# Patient Record
Sex: Female | Born: 1980 | Race: White | Hispanic: No | Marital: Married | Smoking: Former smoker
Health system: Southern US, Community
[De-identification: ages and names within clinical notes are randomized; demographics above are authoritative.]

## PROBLEM LIST (undated history)

## (undated) DIAGNOSIS — F329 Major depressive disorder, single episode, unspecified: Secondary | ICD-10-CM

## (undated) DIAGNOSIS — F32A Depression, unspecified: Secondary | ICD-10-CM

## (undated) DIAGNOSIS — G43909 Migraine, unspecified, not intractable, without status migrainosus: Secondary | ICD-10-CM

## (undated) DIAGNOSIS — F509 Eating disorder, unspecified: Secondary | ICD-10-CM

## (undated) HISTORY — PX: TONSILLECTOMY: SUR1361

## (undated) HISTORY — DX: Migraine, unspecified, not intractable, without status migrainosus: G43.909

## (undated) HISTORY — DX: Major depressive disorder, single episode, unspecified: F32.9

## (undated) HISTORY — DX: Depression, unspecified: F32.A

## (undated) HISTORY — PX: TYMPANOSTOMY TUBE PLACEMENT: SHX32

## (undated) HISTORY — DX: Eating disorder, unspecified: F50.9

---

## 2000-03-05 ENCOUNTER — Other Ambulatory Visit: Admission: RE | Admit: 2000-03-05 | Discharge: 2000-03-05 | Payer: Self-pay | Admitting: Unknown Physician Specialty

## 2002-10-21 ENCOUNTER — Encounter: Payer: Self-pay | Admitting: Orthopedic Surgery

## 2002-10-21 ENCOUNTER — Encounter: Admission: RE | Admit: 2002-10-21 | Discharge: 2002-10-21 | Payer: Self-pay | Admitting: Orthopedic Surgery

## 2002-12-16 ENCOUNTER — Other Ambulatory Visit: Admission: RE | Admit: 2002-12-16 | Discharge: 2002-12-16 | Payer: Self-pay | Admitting: Obstetrics & Gynecology

## 2004-01-30 ENCOUNTER — Other Ambulatory Visit: Admission: RE | Admit: 2004-01-30 | Discharge: 2004-01-30 | Payer: Self-pay | Admitting: Obstetrics and Gynecology

## 2004-01-31 ENCOUNTER — Other Ambulatory Visit: Admission: RE | Admit: 2004-01-31 | Discharge: 2004-01-31 | Payer: Self-pay | Admitting: Gynecology

## 2005-04-16 ENCOUNTER — Inpatient Hospital Stay (HOSPITAL_COMMUNITY): Admission: AD | Admit: 2005-04-16 | Discharge: 2005-04-18 | Payer: Self-pay | Admitting: *Deleted

## 2005-05-17 ENCOUNTER — Other Ambulatory Visit: Admission: RE | Admit: 2005-05-17 | Discharge: 2005-05-17 | Payer: Self-pay | Admitting: Obstetrics and Gynecology

## 2011-10-25 LAB — HM PAP SMEAR: HM Pap smear: NORMAL

## 2012-02-14 ENCOUNTER — Ambulatory Visit: Payer: Self-pay | Admitting: Family Medicine

## 2012-02-28 ENCOUNTER — Ambulatory Visit (INDEPENDENT_AMBULATORY_CARE_PROVIDER_SITE_OTHER): Payer: BC Managed Care – PPO | Admitting: Family Medicine

## 2012-02-28 ENCOUNTER — Encounter: Payer: Self-pay | Admitting: Family Medicine

## 2012-02-28 VITALS — Temp 98.3°F | Ht 69.25 in | Wt 236.6 lb

## 2012-02-28 DIAGNOSIS — F411 Generalized anxiety disorder: Secondary | ICD-10-CM

## 2012-02-28 DIAGNOSIS — R519 Headache, unspecified: Secondary | ICD-10-CM | POA: Insufficient documentation

## 2012-02-28 DIAGNOSIS — F988 Other specified behavioral and emotional disorders with onset usually occurring in childhood and adolescence: Secondary | ICD-10-CM

## 2012-02-28 DIAGNOSIS — X58XXXS Exposure to other specified factors, sequela: Secondary | ICD-10-CM

## 2012-02-28 DIAGNOSIS — IMO0001 Reserved for inherently not codable concepts without codable children: Secondary | ICD-10-CM

## 2012-02-28 DIAGNOSIS — S0990XS Unspecified injury of head, sequela: Secondary | ICD-10-CM

## 2012-02-28 DIAGNOSIS — R232 Flushing: Secondary | ICD-10-CM

## 2012-02-28 DIAGNOSIS — G44309 Post-traumatic headache, unspecified, not intractable: Secondary | ICD-10-CM

## 2012-02-28 DIAGNOSIS — R51 Headache: Secondary | ICD-10-CM

## 2012-02-28 DIAGNOSIS — F419 Anxiety disorder, unspecified: Secondary | ICD-10-CM | POA: Insufficient documentation

## 2012-02-28 LAB — BASIC METABOLIC PANEL
BUN: 9 mg/dL (ref 6–23)
Chloride: 103 mEq/L (ref 96–112)
Creat: 0.92 mg/dL (ref 0.50–1.10)
Glucose, Bld: 87 mg/dL (ref 70–99)
Potassium: 3.9 mEq/L (ref 3.5–5.3)

## 2012-02-28 LAB — HEPATIC FUNCTION PANEL
Albumin: 4.6 g/dL (ref 3.5–5.2)
Alkaline Phosphatase: 50 U/L (ref 39–117)
Total Bilirubin: 0.7 mg/dL (ref 0.3–1.2)
Total Protein: 7.2 g/dL (ref 6.0–8.3)

## 2012-02-28 LAB — CBC WITH DIFFERENTIAL/PLATELET
Basophils Absolute: 0 10*3/uL (ref 0.0–0.1)
Basophils Relative: 0 % (ref 0–1)
Eosinophils Absolute: 0.1 10*3/uL (ref 0.0–0.7)
Eosinophils Relative: 1 % (ref 0–5)
MCH: 27.7 pg (ref 26.0–34.0)
MCHC: 34.3 g/dL (ref 30.0–36.0)
MCV: 81 fL (ref 78.0–100.0)
Neutrophils Relative %: 51 % (ref 43–77)
Platelets: 331 10*3/uL (ref 150–400)
RBC: 4.83 MIL/uL (ref 3.87–5.11)
RDW: 13.6 % (ref 11.5–15.5)

## 2012-02-28 MED ORDER — ALPRAZOLAM 0.5 MG PO TABS: 1.0000 mg | ORAL_TABLET | ORAL | Status: DC | PRN

## 2012-02-28 MED ORDER — AMPHETAMINE-DEXTROAMPHET ER 20 MG PO CP24: 20.0000 mg | ORAL_CAPSULE | ORAL | Status: DC

## 2012-02-28 MED ORDER — CYCLOBENZAPRINE HCL 10 MG PO TABS: 10.0000 mg | ORAL_TABLET | Freq: Three times a day (TID) | ORAL | Status: AC | PRN
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
## 2012-02-28 NOTE — Assessment & Plan Note (Signed)
adderall xr 20 mg  Daily rto 1 month or sooner prn

## 2012-02-28 NOTE — Assessment & Plan Note (Signed)
con't meds 

## 2012-02-28 NOTE — Patient Instructions (Signed)
Attention Deficit Hyperactivity Disorder Attention deficit hyperactivity disorder (ADHD) is a problem with behavior issues based on the way the brain functions (neurobehavioral disorder). It is a common reason for behavior and academic problems in school. CAUSES  The cause of ADHD is unknown in most cases. It may run in families. It sometimes can be associated with learning disabilities and other behavioral problems. SYMPTOMS  There are 3 types of ADHD. The 3 types and some of the symptoms include:  Inattentive   Gets bored or distracted easily.   Loses or forgets things. Forgets to hand in homework.   Has trouble organizing or completing tasks.   Difficulty staying on task.   An inability to organize daily tasks and school work.   Leaving projects, chores, or homework unfinished.   Trouble paying attention or responding to details. Careless mistakes.   Difficulty following directions. Often seems like is not listening.   Dislikes activities that require sustained attention (like chores or homework).   Hyperactive-impulsive   Feels like it is impossible to sit still or stay in a seat. Fidgeting with hands and feet.   Trouble waiting turn.   Talking too much or out of turn. Interruptive.   Speaks or acts impulsively.   Aggressive, disruptive behavior.   Constantly busy or on the go, noisy.   Combined   Has symptoms of both of the above.  Often children with ADHD feel discouraged about themselves and with school. They often perform well below their abilities in school. These symptoms can cause problems in home, school, and in relationships with peers. As children get older, the excess motor activities can calm down, but the problems with paying attention and staying organized persist. Most children do not outgrow ADHD but with good treatment can learn to cope with the symptoms. DIAGNOSIS  When ADHD is suspected, the diagnosis should be made by professionals trained in  ADHD.  Diagnosis will include:  Ruling out other reasons for the child's behavior.   The caregivers will check with the child's school and check their medical records.   They will talk to teachers and parents.   Behavior rating scales for the child will be filled out by those dealing with the child on a daily basis.  A diagnosis is made only after all information has been considered. TREATMENT  Treatment usually includes behavioral treatment often along with medicines. It may include stimulant medicines. The stimulant medicines decrease impulsivity and hyperactivity and increase attention. Other medicines used include antidepressants and certain blood pressure medicines. Most experts agree that treatment for ADHD should address all aspects of the child's functioning. Treatment should not be limited to the use of medicines alone. Treatment should include structured classroom management. The parents must receive education to address rewarding good behavior, discipline, and limit-setting. Tutoring or behavioral therapy or both should be available for the child. If untreated, the disorder can have long-term serious effects into adolescence and adulthood. HOME CARE INSTRUCTIONS   Often with ADHD there is a lot of frustration among the family in dealing with the illness. There is often blame and anger that is not warranted. This is a life long illness. There is no way to prevent ADHD. In many cases, because the problem affects the family as a whole, the entire family may need help. A therapist can help the family find better ways to handle the disruptive behaviors and promote change. If the child is young, most of the therapist's work is with the parents. Parents will   learn techniques for coping with and improving their child's behavior. Sometimes only the child with the ADHD needs counseling. Your caregivers can help you make these decisions.   Children with ADHD may need help in organizing. Some  helpful tips include:   Keep routines the same every day from wake-up time to bedtime. Schedule everything. This includes homework and playtime. This should include outdoor and indoor recreation. Keep the schedule on the refrigerator or a bulletin board where it is frequently seen. Mark schedule changes as far in advance as possible.   Have a place for everything and keep everything in its place. This includes clothing, backpacks, and school supplies.   Encourage writing down assignments and bringing home needed books.   Offer your child a well-balanced diet. Breakfast is especially important for school performance. Children should avoid drinks with caffeine including:   Soft drinks.   Coffee.   Tea.   However, some older children (adolescents) may find these drinks helpful in improving their attention.   Children with ADHD need consistent rules that they can understand and follow. If rules are followed, give small rewards. Children with ADHD often receive, and expect, criticism. Look for good behavior and praise it. Set realistic goals. Give clear instructions. Look for activities that can foster success and self-esteem. Make time for pleasant activities with your child. Give lots of affection.   Parents are their children's greatest advocates. Learn as much as possible about ADHD. This helps you become a stronger and better advocate for your child. It also helps you educate your child's teachers and instructors if they feel inadequate in these areas. Parent support groups are often helpful. A national group with local chapters is called CHADD (Children and Adults with Attention Deficit Hyperactivity Disorder).  PROGNOSIS  There is no cure for ADHD. Children with the disorder seldom outgrow it. Many find adaptive ways to accommodate the ADHD as they mature. SEEK MEDICAL CARE IF:  Your child has repeated muscle twitches, cough or speech outbursts.   Your child has sleep problems.   Your  child has a marked loss of appetite.   Your child develops depression.   Your child has new or worsening behavioral problems.   Your child develops dizziness.   Your child has a racing heart.   Your child has stomach pains.   Your child develops headaches.  Document Released: 08/30/2002 Document Revised: 08/29/2011 Document Reviewed: 04/11/2008 Exit[employer] Patient Information 2012 [employer]. 
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
## 2012-02-29 LAB — T3, FREE: T3, Free: 3.3 pg/mL (ref 2.3–4.2)

## 2012-02-29 LAB — T4, FREE: Free T4: 1.28 ng/dL (ref 0.80–1.80)

## 2012-02-29 LAB — TSH: TSH: 1.443 u[IU]/mL (ref 0.350–4.500)

## 2012-03-07 ENCOUNTER — Ambulatory Visit (HOSPITAL_BASED_OUTPATIENT_CLINIC_OR_DEPARTMENT_OTHER)
Admission: RE | Admit: 2012-03-07 | Discharge: 2012-03-07 | Disposition: A | Discharge status: Home / Self Care | Payer: BC Managed Care – PPO | Source: Ambulatory Visit | Attending: Family Medicine | Admitting: Family Medicine

## 2012-03-07 DIAGNOSIS — IMO0001 Reserved for inherently not codable concepts without codable children: Secondary | ICD-10-CM | POA: Insufficient documentation

## 2012-03-07 DIAGNOSIS — G44329 Chronic post-traumatic headache, not intractable: Secondary | ICD-10-CM | POA: Insufficient documentation

## 2012-03-07 DIAGNOSIS — G44309 Post-traumatic headache, unspecified, not intractable: Secondary | ICD-10-CM

## 2012-03-11 ENCOUNTER — Telehealth: Payer: Self-pay

## 2012-03-11 DIAGNOSIS — F988 Other specified behavioral and emotional disorders with onset usually occurring in childhood and adolescence: Secondary | ICD-10-CM

## 2012-03-11 NOTE — Telephone Encounter (Signed)
Spoke with patient and she stated she is taking the Adderall but it is wearing off after about 2 hours. She wants to know if she needs to increase. Please advise    KP

## 2012-03-11 NOTE — Telephone Encounter (Signed)
She can try taking 2--- if it is too much we can change to 30 mg .

## 2012-03-11 NOTE — Progress Notes (Signed)
If no relief from chiropractor --- refer neuro

## 2012-03-12 NOTE — Telephone Encounter (Signed)
Discussed with patient and she voiced understanding. She will call on Monday with results.     KP

## 2012-03-18 MED ORDER — AMPHETAMINE-DEXTROAMPHET ER 20 MG PO CP24: 20.0000 mg | ORAL_CAPSULE | ORAL | Status: DC

## 2012-03-18 NOTE — Telephone Encounter (Signed)
Patient aware Rx will be ready for pick up tomorrow.      KP 

## 2012-03-18 NOTE — Telephone Encounter (Signed)
Please advise      KP 

## 2012-03-18 NOTE — Telephone Encounter (Signed)
Ok to increase to 2 a day--- we discussed it when she was here

## 2012-03-18 NOTE — Addendum Note (Signed)
Addended by: Arnette Norris on: 03/18/2012 01:49 PM   Modules accepted: Orders

## 2012-03-18 NOTE — Telephone Encounter (Signed)
Patient called today and stated the 2-pills worked and she is now in need of a refill Please call when ready [phone], advised on prescription policy patient voiced understanding

## 2012-04-03 ENCOUNTER — Ambulatory Visit (INDEPENDENT_AMBULATORY_CARE_PROVIDER_SITE_OTHER): Payer: BC Managed Care – PPO | Admitting: Family Medicine

## 2012-04-03 ENCOUNTER — Encounter: Payer: Self-pay | Admitting: Family Medicine

## 2012-04-03 VITALS — BP 120/80 | HR 92 | Temp 98.1°F | Wt 239.2 lb

## 2012-04-03 DIAGNOSIS — R51 Headache: Secondary | ICD-10-CM

## 2012-04-03 DIAGNOSIS — F329 Major depressive disorder, single episode, unspecified: Secondary | ICD-10-CM | POA: Insufficient documentation

## 2012-04-03 DIAGNOSIS — F988 Other specified behavioral and emotional disorders with onset usually occurring in childhood and adolescence: Secondary | ICD-10-CM

## 2012-04-03 MED ORDER — AMPHETAMINE-DEXTROAMPHET ER 30 MG PO CP24: ORAL_CAPSULE | ORAL | Status: DC

## 2012-04-03 MED ORDER — FLUOXETINE HCL 20 MG PO TABS: ORAL_TABLET | ORAL | Status: DC

## 2012-04-03 NOTE — Patient Instructions (Signed)
Attention Deficit Hyperactivity Disorder Attention deficit hyperactivity disorder (ADHD) is a problem with behavior issues based on the way the brain functions (neurobehavioral disorder). It is a common reason for behavior and academic problems in school. CAUSES  The cause of ADHD is unknown in most cases. It may run in families. It sometimes can be associated with learning disabilities and other behavioral problems. SYMPTOMS  There are 3 types of ADHD. The 3 types and some of the symptoms include:  Inattentive   Gets bored or distracted easily.   Loses or forgets things. Forgets to hand in homework.   Has trouble organizing or completing tasks.   Difficulty staying on task.   An inability to organize daily tasks and school work.   Leaving projects, chores, or homework unfinished.   Trouble paying attention or responding to details. Careless mistakes.   Difficulty following directions. Often seems like is not listening.   Dislikes activities that require sustained attention (like chores or homework).   Hyperactive-impulsive   Feels like it is impossible to sit still or stay in a seat. Fidgeting with hands and feet.   Trouble waiting turn.   Talking too much or out of turn. Interruptive.   Speaks or acts impulsively.   Aggressive, disruptive behavior.   Constantly busy or on the go, noisy.   Combined   Has symptoms of both of the above.  Often children with ADHD feel discouraged about themselves and with school. They often perform well below their abilities in school. These symptoms can cause problems in home, school, and in relationships with peers. As children get older, the excess motor activities can calm down, but the problems with paying attention and staying organized persist. Most children do not outgrow ADHD but with good treatment can learn to cope with the symptoms. DIAGNOSIS  When ADHD is suspected, the diagnosis should be made by professionals trained in  ADHD.  Diagnosis will include:  Ruling out other reasons for the child's behavior.   The caregivers will check with the child's school and check their medical records.   They will talk to teachers and parents.   Behavior rating scales for the child will be filled out by those dealing with the child on a daily basis.  A diagnosis is made only after all information has been considered. TREATMENT  Treatment usually includes behavioral treatment often along with medicines. It may include stimulant medicines. The stimulant medicines decrease impulsivity and hyperactivity and increase attention. Other medicines used include antidepressants and certain blood pressure medicines. Most experts agree that treatment for ADHD should address all aspects of the child's functioning. Treatment should not be limited to the use of medicines alone. Treatment should include structured classroom management. The parents must receive education to address rewarding good behavior, discipline, and limit-setting. Tutoring or behavioral therapy or both should be available for the child. If untreated, the disorder can have long-term serious effects into adolescence and adulthood. HOME CARE INSTRUCTIONS   Often with ADHD there is a lot of frustration among the family in dealing with the illness. There is often blame and anger that is not warranted. This is a life long illness. There is no way to prevent ADHD. In many cases, because the problem affects the family as a whole, the entire family may need help. A therapist can help the family find better ways to handle the disruptive behaviors and promote change. If the child is young, most of the therapist's work is with the parents. Parents will   learn techniques for coping with and improving their child's behavior. Sometimes only the child with the ADHD needs counseling. Your caregivers can help you make these decisions.   Children with ADHD may need help in organizing. Some  helpful tips include:   Keep routines the same every day from wake-up time to bedtime. Schedule everything. This includes homework and playtime. This should include outdoor and indoor recreation. Keep the schedule on the refrigerator or a bulletin board where it is frequently seen. Mark schedule changes as far in advance as possible.   Have a place for everything and keep everything in its place. This includes clothing, backpacks, and school supplies.   Encourage writing down assignments and bringing home needed books.   Offer your child a well-balanced diet. Breakfast is especially important for school performance. Children should avoid drinks with caffeine including:   Soft drinks.   Coffee.   Tea.   However, some older children (adolescents) may find these drinks helpful in improving their attention.   Children with ADHD need consistent rules that they can understand and follow. If rules are followed, give small rewards. Children with ADHD often receive, and expect, criticism. Look for good behavior and praise it. Set realistic goals. Give clear instructions. Look for activities that can foster success and self-esteem. Make time for pleasant activities with your child. Give lots of affection.   Parents are their children's greatest advocates. Learn as much as possible about ADHD. This helps you become a stronger and better advocate for your child. It also helps you educate your child's teachers and instructors if they feel inadequate in these areas. Parent support groups are often helpful. A national group with local chapters is called CHADD (Children and Adults with Attention Deficit Hyperactivity Disorder).  PROGNOSIS  There is no cure for ADHD. Children with the disorder seldom outgrow it. Many find adaptive ways to accommodate the ADHD as they mature. SEEK MEDICAL CARE IF:  Your child has repeated muscle twitches, cough or speech outbursts.   Your child has sleep problems.   Your  child has a marked loss of appetite.   Your child develops depression.   Your child has new or worsening behavioral problems.   Your child develops dizziness.   Your child has a racing heart.   Your child has stomach pains.   Your child develops headaches.  Document Released: 08/30/2002 Document Revised: 08/29/2011 Document Reviewed: 04/11/2008 Exit[employer] Patient Information 2012 [employer]. 

## 2012-04-03 NOTE — Assessment & Plan Note (Signed)
Increase to prozac to 60 mg  20 mg  ,  3 tab a day con't wellbutrin

## 2012-04-03 NOTE — Progress Notes (Signed)
  Subjective:    Patient ID: Deborah Montes, female    DOB: 03-Jul-1981, 31 y.o.   MRN: [id number]  HPI Pt here f/u depression and add.  She feels like both need to be increased.  Her headaches are better but now having sinus issues.   Pt is not suicidal. Just still having problems with attention. \  Review of Systems As above    Objective:   Physical Exam  Constitutional: She is oriented to person, place, and time. She appears well-developed and well-nourished.  Cardiovascular: Normal rate, regular rhythm and normal heart sounds.   No murmur heard. Pulmonary/Chest: Effort normal and breath sounds normal.  Neurological: She is alert and oriented to person, place, and time.  Skin: Skin is warm and dry.  Psychiatric: She has a normal mood and affect. Her behavior is normal. Judgment and thought content normal.          Assessment & Plan:

## 2012-04-03 NOTE — Assessment & Plan Note (Signed)
Increase adderall to 30 mg 2 a day rto 6 months or sooner prn

## 2012-04-03 NOTE — Assessment & Plan Note (Signed)
?   Sinus---  Take flonase daily and con't other allergy meds If symptoms persist --rto to be evaluated for abx

## 2012-04-08 ENCOUNTER — Telehealth: Payer: Self-pay | Admitting: *Deleted

## 2012-04-08 MED ORDER — AMPHETAMINE-DEXTROAMPHETAMINE 20 MG PO TABS: 20.0000 mg | ORAL_TABLET | Freq: Two times a day (BID) | ORAL | Status: DC

## 2012-04-08 NOTE — Telephone Encounter (Signed)
Pt called to ask if MD Lowne can switch her Adderall RX to the regular formula instead of the XR formula per notes recent OV with MD Lowne and had discussion about the change and has now decided she does want to switch, pt notes she has NOT gotten the recent RX filled for Adderall XR 30mg  filled and wanted to know if she brought this RX back could she change for the new RX, please advise

## 2012-04-08 NOTE — Telephone Encounter (Signed)
Ok to bring back rx and give her rx for adderall 20 mg bid and ov in 1 month

## 2012-04-08 NOTE — Telephone Encounter (Signed)
Discussed with patient and she will bring back the old RX.     KP

## 2012-04-10 ENCOUNTER — Telehealth: Payer: Self-pay | Admitting: *Deleted

## 2012-04-10 NOTE — Telephone Encounter (Signed)
agreed

## 2012-04-10 NOTE — Telephone Encounter (Signed)
Pt states that the new Rx for adderall is not strong enough and would like to change back to her previous dose. Verbally advised Dr Laury Axon who states that she will not change Rx back. Per kim provide Pt with list of psych. Discuss with patient Will placed list up front for Pt to pick up.

## 2012-04-13 ENCOUNTER — Ambulatory Visit (INDEPENDENT_AMBULATORY_CARE_PROVIDER_SITE_OTHER): Payer: BC Managed Care – PPO | Admitting: Family Medicine

## 2012-04-13 ENCOUNTER — Encounter: Payer: Self-pay | Admitting: Family Medicine

## 2012-04-13 VITALS — BP 120/82 | HR 94 | Temp 98.5°F | Wt 236.4 lb

## 2012-04-13 DIAGNOSIS — F988 Other specified behavioral and emotional disorders with onset usually occurring in childhood and adolescence: Secondary | ICD-10-CM

## 2012-04-13 MED ORDER — AMPHETAMINE-DEXTROAMPHET ER 20 MG PO CP24: 20.0000 mg | ORAL_CAPSULE | ORAL | Status: DC

## 2012-04-13 MED ORDER — FLUOXETINE HCL 20 MG PO TABS: ORAL_TABLET | ORAL | Status: DC

## 2012-04-13 NOTE — Assessment & Plan Note (Signed)
adderall 20 mg bid--- pt gave [place] XR rx Make appointment with psych rto 6 months or sooner prn

## 2012-04-13 NOTE — Progress Notes (Signed)
  Subjective:    Patient ID: Deborah Montes, female    DOB: Nov 21, 1980, 31 y.o.   MRN: [id number]  HPI Pt here f/u ADD--- the xr made her sick so she would like to switch back to immediate release. No other complaints and she is willing to go to psych for testing.   Review of Systems As above    Objective:   Physical Exam  Constitutional: She is oriented to person, place, and time. She appears well-developed and well-nourished.  Neck: Normal range of motion. Neck supple.  Cardiovascular: Normal rate, regular rhythm and normal heart sounds.   No murmur heard. Pulmonary/Chest: Effort normal and breath sounds normal. No respiratory distress. She has no wheezes. She has no rales. She exhibits no tenderness.  Neurological: She is alert and oriented to person, place, and time.  Psychiatric: She has a normal mood and affect. Her behavior is normal. Judgment and thought content normal.          Assessment & Plan:

## 2012-04-13 NOTE — Patient Instructions (Signed)
Attention Deficit Hyperactivity Disorder Attention deficit hyperactivity disorder (ADHD) is a problem with behavior issues based on the way the brain functions (neurobehavioral disorder). It is a common reason for behavior and academic problems in school. CAUSES  The cause of ADHD is unknown in most cases. It may run in families. It sometimes can be associated with learning disabilities and other behavioral problems. SYMPTOMS  There are 3 types of ADHD. The 3 types and some of the symptoms include:  Inattentive   Gets bored or distracted easily.   Loses or forgets things. Forgets to hand in homework.   Has trouble organizing or completing tasks.   Difficulty staying on task.   An inability to organize daily tasks and school work.   Leaving projects, chores, or homework unfinished.   Trouble paying attention or responding to details. Careless mistakes.   Difficulty following directions. Often seems like is not listening.   Dislikes activities that require sustained attention (like chores or homework).   Hyperactive-impulsive   Feels like it is impossible to sit still or stay in a seat. Fidgeting with hands and feet.   Trouble waiting turn.   Talking too much or out of turn. Interruptive.   Speaks or acts impulsively.   Aggressive, disruptive behavior.   Constantly busy or on the go, noisy.   Combined   Has symptoms of both of the above.  Often children with ADHD feel discouraged about themselves and with school. They often perform well below their abilities in school. These symptoms can cause problems in home, school, and in relationships with peers. As children get older, the excess motor activities can calm down, but the problems with paying attention and staying organized persist. Most children do not outgrow ADHD but with good treatment can learn to cope with the symptoms. DIAGNOSIS  When ADHD is suspected, the diagnosis should be made by professionals trained in  ADHD.  Diagnosis will include:  Ruling out other reasons for the child's behavior.   The caregivers will check with the child's school and check their medical records.   They will talk to teachers and parents.   Behavior rating scales for the child will be filled out by those dealing with the child on a daily basis.  A diagnosis is made only after all information has been considered. TREATMENT  Treatment usually includes behavioral treatment often along with medicines. It may include stimulant medicines. The stimulant medicines decrease impulsivity and hyperactivity and increase attention. Other medicines used include antidepressants and certain blood pressure medicines. Most experts agree that treatment for ADHD should address all aspects of the child's functioning. Treatment should not be limited to the use of medicines alone. Treatment should include structured classroom management. The parents must receive education to address rewarding good behavior, discipline, and limit-setting. Tutoring or behavioral therapy or both should be available for the child. If untreated, the disorder can have long-term serious effects into adolescence and adulthood. HOME CARE INSTRUCTIONS   Often with ADHD there is a lot of frustration among the family in dealing with the illness. There is often blame and anger that is not warranted. This is a life long illness. There is no way to prevent ADHD. In many cases, because the problem affects the family as a whole, the entire family may need help. A therapist can help the family find better ways to handle the disruptive behaviors and promote change. If the child is young, most of the therapist's work is with the parents. Parents will   learn techniques for coping with and improving their child's behavior. Sometimes only the child with the ADHD needs counseling. Your caregivers can help you make these decisions.   Children with ADHD may need help in organizing. Some  helpful tips include:   Keep routines the same every day from wake-up time to bedtime. Schedule everything. This includes homework and playtime. This should include outdoor and indoor recreation. Keep the schedule on the refrigerator or a bulletin board where it is frequently seen. Mark schedule changes as far in advance as possible.   Have a place for everything and keep everything in its place. This includes clothing, backpacks, and school supplies.   Encourage writing down assignments and bringing home needed books.   Offer your child a well-balanced diet. Breakfast is especially important for school performance. Children should avoid drinks with caffeine including:   Soft drinks.   Coffee.   Tea.   However, some older children (adolescents) may find these drinks helpful in improving their attention.   Children with ADHD need consistent rules that they can understand and follow. If rules are followed, give small rewards. Children with ADHD often receive, and expect, criticism. Look for good behavior and praise it. Set realistic goals. Give clear instructions. Look for activities that can foster success and self-esteem. Make time for pleasant activities with your child. Give lots of affection.   Parents are their children's greatest advocates. Learn as much as possible about ADHD. This helps you become a stronger and better advocate for your child. It also helps you educate your child's teachers and instructors if they feel inadequate in these areas. Parent support groups are often helpful. A national group with local chapters is called CHADD (Children and Adults with Attention Deficit Hyperactivity Disorder).  PROGNOSIS  There is no cure for ADHD. Children with the disorder seldom outgrow it. Many find adaptive ways to accommodate the ADHD as they mature. SEEK MEDICAL CARE IF:  Your child has repeated muscle twitches, cough or speech outbursts.   Your child has sleep problems.   Your  child has a marked loss of appetite.   Your child develops depression.   Your child has new or worsening behavioral problems.   Your child develops dizziness.   Your child has a racing heart.   Your child has stomach pains.   Your child develops headaches.  Document Released: 08/30/2002 Document Revised: 08/29/2011 Document Reviewed: 04/11/2008 Exit[employer] Patient Information 2012 [employer]. 

## 2012-04-21 ENCOUNTER — Telehealth: Payer: Self-pay | Admitting: *Deleted

## 2012-04-21 NOTE — Telephone Encounter (Signed)
Pt needs to be told of their mistake

## 2012-04-21 NOTE — Telephone Encounter (Signed)
Nobody called [place] about this--- we did not rx it

## 2012-04-21 NOTE — Telephone Encounter (Signed)
Spoke with Beth at the pharmacy and although the Rx was written for Adderall 20 xr bid the patient  was given 30 xr bid.  The patient has not called and has not been notified as of this time. The pharmacy was calling to make you aware of the mix up.  At this time Nedra Hai was not available to discuss.  KP

## 2012-04-21 NOTE — Telephone Encounter (Signed)
Received incoming call from Nedra Hai with [facility] at [address] [phone] to note that they have been going through different RX changes per insurance noted refused to pt having Adderall XR 20mg  BID, and notes when pt was changed to "regular release" 20mg  BID this did not work for the pt, pharmacy rep noted that the pt insurance company did advise they will pay for Adderall 30XR QDAY and pt was given this RX and has taken the RX home with her, however during review pharmacy unable to clarify if MD Lowne wants the pt to take the 30mg  XR per in the pt chart this dosage has not been prescribed by MD Laury Axon, note last dosage of Adderall XR 20mg  BID sent on 04-13-12 please advise

## 2012-04-22 NOTE — Telephone Encounter (Signed)
I spoke with Nedra Hai and he said the patient is aware and was ok with trying the Rx.    KP

## 2012-04-27 ENCOUNTER — Ambulatory Visit (HOSPITAL_BASED_OUTPATIENT_CLINIC_OR_DEPARTMENT_OTHER)
Admission: RE | Admit: 2012-04-27 | Discharge: 2012-04-27 | Disposition: A | Discharge status: Home / Self Care | Payer: BC Managed Care – PPO | Source: Ambulatory Visit | Attending: Family Medicine | Admitting: Family Medicine

## 2012-04-27 ENCOUNTER — Encounter: Payer: Self-pay | Admitting: Family Medicine

## 2012-04-27 ENCOUNTER — Ambulatory Visit (INDEPENDENT_AMBULATORY_CARE_PROVIDER_SITE_OTHER): Payer: BC Managed Care – PPO | Admitting: Family Medicine

## 2012-04-27 VITALS — BP 130/76 | HR 84 | Temp 98.2°F | Wt 234.0 lb

## 2012-04-27 DIAGNOSIS — R937 Abnormal findings on diagnostic imaging of other parts of musculoskeletal system: Secondary | ICD-10-CM | POA: Insufficient documentation

## 2012-04-27 DIAGNOSIS — F988 Other specified behavioral and emotional disorders with onset usually occurring in childhood and adolescence: Secondary | ICD-10-CM

## 2012-04-27 MED ORDER — AMPHETAMINE-DEXTROAMPHET ER 30 MG PO CP24: ORAL_CAPSULE | ORAL | Status: DC

## 2012-04-27 NOTE — Progress Notes (Signed)
  Subjective:    Patient ID: Deborah Montes, female    DOB: 07-11-1981, 31 y.o.   MRN: [id number]  HPI Pt here for f/u adderall ----  Pharmacy changed her adderall xr 30 mg  2 po qam without calling us first.  Pt states the 60 mg is working well. Pharmacy is aware of their mistake. Pt is also her f/u chiropractor finding of abnormal clavicle on xray but those xrays are not available.   Review of Systems As above    Objective:   Physical Exam  Constitutional: She is oriented to person, place, and time. She appears well-developed and well-nourished.  Musculoskeletal: She exhibits no edema and no tenderness.       No abnormality of clavicle seen or palpated  Neurological: She is alert and oriented to person, place, and time.  Psychiatric: She has a normal mood and affect. Her behavior is normal. Judgment and thought content normal.          Assessment & Plan:  ? Abnormal clavicle--   Check xray Add--  con't adderall xr  30 mg  2 po qd             rto 6 months or sooner prn

## 2012-05-08 ENCOUNTER — Ambulatory Visit: Payer: BC Managed Care – PPO | Admitting: Family Medicine

## 2012-05-13 ENCOUNTER — Telehealth: Payer: Self-pay | Admitting: *Deleted

## 2012-05-13 NOTE — Telephone Encounter (Signed)
Prior Auth denied: the request does not meet the definition of medical necessity found in the member's benefits booklet.

## 2012-05-13 NOTE — Telephone Encounter (Signed)
We will need to see pt formulary

## 2012-05-13 NOTE — Telephone Encounter (Signed)
Patient aware and will have the formulary sent into the office.     KP

## 2012-05-21 ENCOUNTER — Other Ambulatory Visit: Payer: Self-pay | Admitting: *Deleted

## 2012-05-21 DIAGNOSIS — F988 Other specified behavioral and emotional disorders with onset usually occurring in childhood and adolescence: Secondary | ICD-10-CM

## 2012-05-21 NOTE — Telephone Encounter (Signed)
Pt left VM that she was calling in reference to adderall. Left message to call office to clarify if Pt was requesting a refill or had further concern about PA.

## 2012-05-22 MED ORDER — AMPHETAMINE-DEXTROAMPHET ER 30 MG PO CP24: ORAL_CAPSULE | ORAL | Status: AC

## 2012-05-22 NOTE — Telephone Encounter (Signed)
Pt return call stating that she is calling to request refill on med. Pt aware Rx ready for pickup.

## 2012-06-27 ENCOUNTER — Other Ambulatory Visit: Payer: Self-pay | Admitting: Family Medicine

## 2012-06-29 NOTE — Telephone Encounter (Signed)
Last seen 04/27/12 and filled 02/28/12 #60. please advise     KP

## 2012-10-13 ENCOUNTER — Other Ambulatory Visit: Payer: Self-pay | Admitting: Family Medicine

## 2012-10-13 NOTE — Telephone Encounter (Signed)
Last seen 04/27/12 and filed 06/27/12 # 60. Please advise     KP

## 2013-02-12 ENCOUNTER — Other Ambulatory Visit: Payer: Self-pay | Admitting: Family Medicine

## 2013-02-16 NOTE — Telephone Encounter (Signed)
Prozac refill Last OV 04-27-12 Med last filled on 04-13-12 #90 with 3 refills

## 2013-03-29 ENCOUNTER — Other Ambulatory Visit: Payer: Self-pay | Admitting: Family Medicine

## 2013-04-07 ENCOUNTER — Other Ambulatory Visit: Payer: Self-pay | Admitting: Family Medicine

## 2016-08-27 ENCOUNTER — Ambulatory Visit
Admission: RE | Admit: 2016-08-27 | Discharge: 2016-08-27 | Disposition: A | Discharge status: Home / Self Care | Payer: BLUE CROSS/BLUE SHIELD | Source: Ambulatory Visit | Attending: Family Medicine | Admitting: Family Medicine

## 2016-08-27 ENCOUNTER — Other Ambulatory Visit: Payer: Self-pay | Admitting: Family Medicine

## 2016-08-27 DIAGNOSIS — M25571 Pain in right ankle and joints of right foot: Secondary | ICD-10-CM

## 2016-08-27 DIAGNOSIS — S93401D Sprain of unspecified ligament of right ankle, subsequent encounter: Secondary | ICD-10-CM

## 2017-01-13 ENCOUNTER — Other Ambulatory Visit: Payer: Self-pay | Admitting: Family Medicine

## 2017-01-13 ENCOUNTER — Ambulatory Visit
Admission: RE | Admit: 2017-01-13 | Discharge: 2017-01-13 | Disposition: A | Discharge status: Home / Self Care | Payer: BLUE CROSS/BLUE SHIELD | Source: Ambulatory Visit | Attending: Family Medicine | Admitting: Family Medicine

## 2017-01-13 DIAGNOSIS — M25571 Pain in right ankle and joints of right foot: Secondary | ICD-10-CM

## 2019-01-25 ENCOUNTER — Ambulatory Visit
Admission: RE | Admit: 2019-01-25 | Discharge: 2019-01-25 | Disposition: A | Discharge status: Home / Self Care | Payer: BLUE CROSS/BLUE SHIELD | Source: Ambulatory Visit | Attending: Family Medicine | Admitting: Family Medicine

## 2019-01-25 ENCOUNTER — Other Ambulatory Visit: Payer: Self-pay

## 2019-01-25 ENCOUNTER — Other Ambulatory Visit: Payer: Self-pay | Admitting: Family Medicine

## 2019-01-25 DIAGNOSIS — M79671 Pain in right foot: Secondary | ICD-10-CM

## 2019-01-25 DIAGNOSIS — M25571 Pain in right ankle and joints of right foot: Secondary | ICD-10-CM

## 2019-04-13 ENCOUNTER — Other Ambulatory Visit: Payer: Self-pay

## 2019-04-13 ENCOUNTER — Ambulatory Visit
Admission: RE | Admit: 2019-04-13 | Discharge: 2019-04-13 | Disposition: A | Discharge status: Home / Self Care | Payer: BLUE CROSS/BLUE SHIELD | Source: Ambulatory Visit | Attending: Family Medicine | Admitting: Family Medicine

## 2019-04-13 ENCOUNTER — Other Ambulatory Visit: Payer: Self-pay | Admitting: Family Medicine

## 2019-04-13 DIAGNOSIS — M79671 Pain in right foot: Secondary | ICD-10-CM
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
## 2023-01-23 NOTE — Therapy (Signed)
OUTPATIENT PHYSICAL THERAPY VESTIBULAR EVALUATION     Patient Name: Deborah Montes MRN: [id number] DOB:05-19-1981, 42 y.o., female Today's Date: 01/24/2023  END OF SESSION:  PT End of Session - 01/24/23 1545     Visit Number 1    Number of Visits 9    Date for PT Re-Evaluation 03/25/23    Authorization Type BCBS    PT Start Time 1445    PT Stop Time 1532    PT Time Calculation (min) 47 min    Activity Tolerance Patient tolerated treatment well   limited by dizziness   Behavior During Therapy [facility] for tasks assessed/performed             Past Medical History:  Diagnosis Date   Depression    Eating disorder    Migraine    Past Surgical History:  Procedure Laterality Date   TONSILLECTOMY     TYMPANOSTOMY TUBE PLACEMENT     Patient Active Problem List   Diagnosis Date Noted   Depression 04/03/2012   ADD (attention deficit disorder) 02/28/2012   Headache(784.0) 02/28/2012    PCP: No PCP REFERRING PROVIDER: Karle Barr, MD   REFERRING DIAG: R42 (ICD-10-CM) - Dizziness   THERAPY DIAG:  Dizziness and giddiness  ONSET DATE: 12/10/2022  Rationale for Evaluation and Treatment: Rehabilitation  SUBJECTIVE:   SUBJECTIVE STATEMENT: Has had dizziness for 10+ years and has gotten worse recently. Was sick at the beginning of the year. Saw Dr. Suszanne Conners and was referred here and referred to a neurologist (has not made an appt yet). Got sick from testing with Dr. Suszanne Conners. Reports that she has been feeling worse since then. Will be going on a family trip on a cruise in a month. Pt reports her NP told her about the Epley maneuver and asked her for a scopolamine patch. Tried the Epley maneuver, but unsure if she is doing it right. Reports bending over will make her dizzy, being a passenger in the car esp being in the back seat. Also has headaches, unsure what they are caused from. On a good month, will only get 1 migraine. Tried a "half somersault" last week and felt like it would  really help her. Wants a natural treatment instead of using medication.  Pt accompanied by: self  PERTINENT HISTORY: PMH: Depression, ADD, Headaches, Photophobia  Per Dr. Suszanne Conners: Pt has been symptomatic for 10+ years. Dizziness is a spinning vertigo that lasts for minutes. Testing showed abnormal R ear electrocochleography, suggesting of R ear Meniere's disease. Possibility of vestibular migraine due to frequent headaches/photophobia.   PAIN:  Are you having pain? No  Vitals:   01/24/23 1503  BP: (!) 135/94  Pulse: 84     PRECAUTIONS: None  WEIGHT BEARING RESTRICTIONS: No  FALLS: Has patient fallen in last 6 months? 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   OBJECTIVE:   DIAGNOSTIC FINDINGS: ***  COGNITION: Overall cognitive status: Within functional limits for tasks assessed   SENSATION: {sensation:27233}  EDEMA:  {edema:24020}  MUSCLE TONE:  {LE tone:25568}  DTRs:  {DTR SITE:24025}  POSTURE:  {posture:25561}  Cervical ROM:    AROM: WNL, pt just reports some neck tension  Modified VBI testing: negative, pt reporting some pressure in her head that decr with return to upright.   STRENGTH: ***  LOWER EXTREMITY MMT:  MMT Right eval Left eval  Hip flexion    Hip abduction    Hip adduction    Hip internal rotation    Hip external rotation    Knee flexion    Knee extension    Ankle dorsiflexion    Ankle plantarflexion    Ankle inversion    Ankle eversion    (Blank rows = not tested)  BED MOBILITY:  {Bed mobility:24027}  TRANSFERS: Assistive device utilized: {Assistive devices:23999}  Sit to stand: {Levels of assistance:24026} Stand to sit: {Levels of assistance:24026} Chair to chair: {Levels of assistance:24026} Floor: {Levels of assistance:24026}  RAMP: {Levels of  assistance:24026}  CURB: {Levels of assistance:24026}  GAIT: Gait pattern: {gait characteristics:25376} Distance walked: *** Assistive device utilized: {Assistive devices:23999} Level of assistance: {Levels of assistance:24026} Comments: ***  FUNCTIONAL TESTS:  {Functional tests:24029}  PATIENT SURVEYS:  {rehab surveys:24030}  VESTIBULAR ASSESSMENT:  GENERAL OBSERVATION: Ambulates in with no AD independently.     SYMPTOM BEHAVIOR:  Subjective history: See above.   Non-Vestibular symptoms: headaches, tinnitus, nausea/vomiting, migraine symptoms, and has ringing in ears about 1x a month, nausea/vomiting 1-2x a month and if she is not careful, then she can set it off.  Type of dizziness: Spinning/Vertigo and "feels drunk"   Frequency: "I can make it happen, can't pinpoint an exact frequency)  Duration: Unsure, needs to lay down for spinning to stop. Doesn't last longer >1 day.   Aggravating factors: Induced by motion: looking up at the ceiling, bending down to the ground, sitting in a moving car, and bending down to wash car or take out files  and being in the back seat of a car.   Relieving factors: medication, dark room, closing eyes, and rest  Progression of symptoms: Reports things got worse but then got better.   OCULOMOTOR EXAM:  Ocular Alignment: normal  Ocular ROM: No Limitations  Spontaneous Nystagmus: absent  Gaze-Induced Nystagmus: absent  Smooth Pursuits: intact  Saccades: intact  VESTIBULAR - OCULAR REFLEX:   Slow VOR: Normal  VOR Cancellation: Normal  Head-Impulse Test: HIT Right: positive, very slight HIT Left: negative  Feels a little off afterwards. 3/10 dizziness    Dynamic Visual Acuity: Static: Line 8 Dynamic: Line 6 2 line difference, 6/10 dizziness with PT having to help steady patient.    POSITIONAL TESTING: Right Dix-Hallpike: no nystagmus Left Dix-Hallpike: no nystagmus Right Roll Test: no nystagmus Left Roll Test: no nystagmus Right  Sidelying: no nystagmus and pt reporting dizziness when returning upright Left Sidelying: no nystagmus and pt reporting dizziness when returning upright  MOTION SENSITIVITY:  Motion Sensitivity Quotient Intensity: 0 = none, 1 = Lightheaded, 2 = Mild, 3 = Moderate, 4 = Severe, 5 = Vomiting  Intensity  1. Sitting to supine 0  2. Supine to L side 0  3. Supine to R side 0  4. Supine to sitting 3  5. L Hallpike-Dix 0  6. Up from L  3  7. R Hallpike-Dix 0  8. Up from R  3  9. Sitting, head tipped to L knee   10. Head up from L knee   11. Sitting, head tipped to R knee   12. Head up from R knee   13. Sitting head turns x5   14.Sitting head nods x5   15. In stance, 180 turn to L    16. In stance, 180 turn to R     When rolling back to midline, rates dizziness as 4/10.   Reports when coming  up to sitting, everything feels wavy.    OTHOSTATICS: {Exam; orthostatics:31331}  FUNCTIONAL GAIT: {Functional tests:24029}   VESTIBULAR TREATMENT:                                                                                                   DATE: ***  Canalith Repositioning:  {Canalith Repositioning:25283} Gaze Adaptation:  {gaze adaptation:25286} Habituation:  {habituation:25288} Other: ***  PATIENT EDUCATION: Education details: *** Person educated: {Person educated:25204} Education method: {Education Method:25205} Education comprehension: {Education Comprehension:25206}  HOME EXERCISE PROGRAM:  GOALS: Goals reviewed with patient? {yes/no:20286}  SHORT TERM GOALS: Target date: ***  *** Baseline: Goal status: {GOALSTATUS:25110}  2.  *** Baseline:  Goal status: {GOALSTATUS:25110}  3.  *** Baseline:  Goal status: {GOALSTATUS:25110}  4.  *** Baseline:  Goal status: {GOALSTATUS:25110}  5.  *** Baseline:  Goal status: {GOALSTATUS:25110}  6.  *** Baseline:  Goal status: {GOALSTATUS:25110}  LONG TERM GOALS: Target date: ***  *** Baseline:  Goal status:  {GOALSTATUS:25110}  2.  *** Baseline:  Goal status: {GOALSTATUS:25110}  3.  *** Baseline:  Goal status: {GOALSTATUS:25110}  4.  *** Baseline:  Goal status: {GOALSTATUS:25110}  5.  *** Baseline:  Goal status: {GOALSTATUS:25110}  6.  *** Baseline:  Goal status: {GOALSTATUS:25110}  ASSESSMENT:  CLINICAL IMPRESSION: Patient is a *** y.o. *** who was seen today for physical therapy evaluation and treatment for ***.   OBJECTIVE IMPAIRMENTS: {opptimpairments:25111}.   ACTIVITY LIMITATIONS: {activitylimitations:27494}  PARTICIPATION LIMITATIONS: {participationrestrictions:25113}  PERSONAL FACTORS: {Personal factors:25162} are also affecting patient's functional outcome.   REHAB POTENTIAL: {rehabpotential:25112}  CLINICAL DECISION MAKING: {clinical decision making:25114}  EVALUATION COMPLEXITY: {Evaluation complexity:25115}   PLAN:  PT FREQUENCY: {rehab frequency:25116}  PT DURATION: {rehab duration:25117}  PLANNED INTERVENTIONS: {rehab planned interventions:25118::"Therapeutic exercises","Therapeutic activity","Neuromuscular re-education","Balance training","Gait training","Patient/Family education","Self Care","Joint mobilization"}  PLAN FOR NEXT SESSION: ***   Drake Leach, PT, DPT  01/24/2023, 3:47 PM

## 2023-01-24 ENCOUNTER — Ambulatory Visit: Payer: BC Managed Care – PPO | Attending: Otolaryngology | Admitting: Physical Therapy

## 2023-01-24 ENCOUNTER — Encounter: Payer: Self-pay | Admitting: Physical Therapy

## 2023-01-24 VITALS — BP 135/94 | HR 84

## 2023-01-24 DIAGNOSIS — R42 Dizziness and giddiness: Secondary | ICD-10-CM | POA: Insufficient documentation

## 2023-01-31 ENCOUNTER — Ambulatory Visit: Payer: BC Managed Care – PPO | Admitting: Physical Therapy

## 2023-01-31 ENCOUNTER — Encounter: Payer: Self-pay | Admitting: Physical Therapy

## 2023-01-31 DIAGNOSIS — R42 Dizziness and giddiness: Secondary | ICD-10-CM

## 2023-01-31 NOTE — Therapy (Signed)
OUTPATIENT PHYSICAL THERAPY VESTIBULAR TREATMENT     Patient Name: Deborah Montes MRN: [id number] DOB:20-Oct-1980, 42 y.o., female Today's Date: 01/31/2023  END OF SESSION:  PT End of Session - 01/31/23 1448     Visit Number 2    Number of Visits 9    Date for PT Re-Evaluation 03/25/23    Authorization Type BCBS    PT Start Time 1447    PT Stop Time 1535    PT Time Calculation (min) 48 min    Activity Tolerance Patient tolerated treatment well    Behavior During Therapy [facility] for tasks assessed/performed             Past Medical History:  Diagnosis Date   Depression    Eating disorder    Migraine    Past Surgical History:  Procedure Laterality Date   TONSILLECTOMY     TYMPANOSTOMY TUBE PLACEMENT     Patient Active Problem List   Diagnosis Date Noted   Depression 04/03/2012   ADD (attention deficit disorder) 02/28/2012   Headache(784.0) 02/28/2012    PCP: No PCP REFERRING PROVIDER: Karle Barr, MD   REFERRING DIAG: R42 (ICD-10-CM) - Dizziness   THERAPY DIAG:  Dizziness and giddiness  ONSET DATE: 12/10/2022  Rationale for Evaluation and Treatment: Rehabilitation  SUBJECTIVE:   SUBJECTIVE STATEMENT: Sometimes going from the car to inside will make her feel a little shaky. Has not had any episodes of dizziness where she had to take the Meclizine.  Pt accompanied by: self  PERTINENT HISTORY: PMH: Depression, ADD, Headaches, Photophobia  Per Dr. Suszanne Conners: Pt has been symptomatic for 10+ years. Dizziness is a spinning vertigo that lasts for minutes. Testing showed abnormal R ear electrocochleography, suggesting of R ear Meniere's disease. Possibility of vestibular migraine due to frequent headaches/photophobia.   PAIN:  Are you having pain? No  There were no vitals filed for this visit.    PRECAUTIONS: None  WEIGHT BEARING RESTRICTIONS: No  FALLS: Has patient fallen in last 6 months? 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   OBJECTIVE:   PATIENT SURVEYS:  FOTO DPS: 48, DFS: 44.4   VESTIBULAR ASSESSMENT:  VESTIBULAR - OCULAR REFLEX:   Slow VOR: Normal  VOR Cancellation: Normal  Head-Impulse Test: HIT Right: positive, very slight HIT Left: negative  Feels a little off afterwards. 3/10 dizziness    Dynamic Visual Acuity: Static: Line 8 Dynamic: Line 6 2 line difference, 6/10 dizziness with PT having to help steady patient afterwards.    POSITIONAL TESTING: Right Dix-Hallpike: no nystagmus Left Dix-Hallpike: no nystagmus Right Roll Test: no nystagmus Left Roll Test: no nystagmus Right Sidelying: no nystagmus and pt reporting dizziness when returning upright Left Sidelying: no nystagmus and pt reporting dizziness when returning upright   VESTIBULAR TREATMENT:  MOTION SENSITIVITY:  Motion Sensitivity Quotient Intensity: 0 = none, 1 = Lightheaded, 2 = Mild, 3 = Moderate, 4 = Severe, 5 = Vomiting  Bolded performed on 01/31/23  Intensity  1. Sitting to supine 0  2. Supine to L side 0  3. Supine to R side 0  4. Supine to sitting 3  5. L Hallpike-Dix 0  6. Up from L  3  7. R Hallpike-Dix 0  8. Up from R  3  9. Sitting, head tipped to L knee 0  10. Head up from L knee 0  11. Sitting, head tipped to R knee 0  12. Head up from R knee 0  13. Sitting head turns x5 0  14.Sitting head nods x5 0  15. In stance, 180 turn to L  0  16. In stance, 180 turn to R 0     Standing bending down to R/L: 0   Standing head turns: 0   Standing head nods: 0     M-CTSIB  Condition 1: Firm Surface, EO 30 Sec, Normal Sway  Condition 2: Firm Surface, EC 30 Sec, Normal Sway  Condition 3: Foam Surface, EO 30 Sec, Normal Sway  Condition 4: Foam Surface, EC 30 Sec,  Incr postural sway    NMR:   Gaze Adaptation: VOR x 1 Horizontal: Seated x30 seconds with no symptoms, Standing x30 seconds no symptoms and then with busy background x30 seconds, feeling a little off  VOR x 1 Vertical: Standing x30 seconds no symptoms and then with busy background x30 seconds, feeling a little off  Cues to make sure eyes stay in focus and proper head motion.  Austin Miles x2 reps each side   On rockerboard in A/P direction: EO rocking board with head turns x10 reps EC static stance x30 seconds EC while rocking board 2 x 30 seconds    PATIENT EDUCATION: Education details: Answered pt's questions  Person educated: Patient Education method: Explanation Education comprehension: verbalized understanding  HOME EXERCISE PROGRAM: Standing VOR x1 with busy background   Access Code: [email or web address] URL: [email or web address] Date: 01/31/2023 Prepared by: Sherlie Ban  Exercises - Romberg Stance Eyes Closed on Foam Pad  - 1-2 x daily - 5 x weekly - 3 sets - 30 hold - Romberg Stance with Head Nods on Foam Pad  - 1 x daily - 5 x weekly - 2 sets - 10 reps - Standing Marching  - 1 x daily - 5 x weekly - 1-2 sets - 10 reps    GOALS: Goals reviewed with patient? Yes  SHORT TERM GOALS: ALL STGS = LTGS  LONG TERM GOALS: Target date: 02/24/2023  Pt will perform DVA testing with 2 line difference or less with 2/10 dizziness afterwards in order to demo improved VOR.  Baseline: 2 line difference, 6/10 dizziness with PT having to help steady patient afterwards.  Goal status: INITIAL  2.  MSQ to finish being assessed with LTG written.  Baseline:  Goal status: INITIAL  3.  mCTSIB to be assessed with goal written.  Baseline: goal not needed, 30 seconds on all  Goal status: GOAL N/A  4. Pt will improve DPS to at least 58 in order to demo improved functional outcomes related to dizziness.  Baseline: 48 Goal status: INITIAL   ASSESSMENT:  CLINICAL  IMPRESSION: Patient is a 42 year old female referred to Neuro OPPT for dizziness. Per Dr. Suszanne Conners: Pt has been symptomatic for 10+ years. Dizziness is a spinning vertigo that lasts  for minutes. Testing showed abnormal R ear electrocochleography, suggesting of R ear Meniere's disease. Possibility of vestibular migraine due to frequent headaches/photophobia. Pt's PMH is significant for: Depression, ADD, Headaches, Photophobia. The following deficits were present during the exam: negative positional testing, moderate dizziness with return to upright from supine position, positive R HIT, 2 line difference and 6/10 dizziness after DVA, indicating impaired VOR. Will have to do further vestibular testing at next session.  Pt would benefit from skilled PT to address these impairments and functional limitations to maximize functional mobility independence and to decr dizziness.   OBJECTIVE IMPAIRMENTS: decreased activity tolerance, decreased balance, and dizziness.   ACTIVITY LIMITATIONS: carrying, lifting, bending, bed mobility, reach over head, and locomotion level  PARTICIPATION LIMITATIONS: community activity  PERSONAL FACTORS: Age, Past/current experiences, Time since onset of injury/illness/exacerbation, and 3+ comorbidities: Depression, ADD, Headaches, Photophobia  are also affecting patient's functional outcome.   REHAB POTENTIAL: Good  CLINICAL DECISION MAKING: Evolving/moderate complexity  EVALUATION COMPLEXITY: Moderate   PLAN:  PT FREQUENCY: 1x/week  PT DURATION: 8 weeks  PLANNED INTERVENTIONS: Therapeutic exercises, Therapeutic activity, Neuromuscular re-education, Balance training, Gait training, Patient/Family education, Self Care, Joint mobilization, Vestibular training, Manual therapy, and Re-evaluation  PLAN FOR NEXT SESSION: Work on vestibular system for balance. Bending for a while, reaching up. Progress VOR   Schedule a follow-up visit after she has her cruise    Drake Leach, PT, DPT  01/31/2023, 3:47 PM

## 2023-01-31 NOTE — Patient Instructions (Signed)
Gaze Stabilization: Standing Feet Apart    Feet shoulder width apart, keeping eyes on target on wall a feet away, tilt head down 15-30 and move head side to side for __30__ seconds. Repeat while moving head up and down for __30__ seconds. Perform 2-3 sets of each.   Do __1-2__ sessions per day. Use a patterned background. Make sure it stays in focus

## 2023-02-07 ENCOUNTER — Ambulatory Visit: Payer: BC Managed Care – PPO

## 2023-02-07 DIAGNOSIS — R42 Dizziness and giddiness: Secondary | ICD-10-CM

## 2023-02-07 NOTE — Therapy (Signed)
OUTPATIENT PHYSICAL THERAPY VESTIBULAR TREATMENT     Patient Name: Deborah Montes MRN: [id number] DOB:07-01-1981, 42 y.o., female Today's Date: 02/10/2023  END OF SESSION:   02/07/23 1535  PT Visits / Re-Eval  Visit Number 3  Number of Visits 9  Date for PT Re-Evaluation 03/25/23  Authorization  Authorization Type BCBS  PT Time Calculation  PT Start Time 1533  PT Stop Time 1617  PT Time Calculation (min) 44 min  PT - End of Session  Activity Tolerance Patient tolerated treatment well  Behavior During Therapy WFL for tasks assessed/performed     Past Medical History:  Diagnosis Date   Depression    Eating disorder    Migraine    Past Surgical History:  Procedure Laterality Date   TONSILLECTOMY     TYMPANOSTOMY TUBE PLACEMENT     Patient Active Problem List   Diagnosis Date Noted   Depression 04/03/2012   ADD (attention deficit disorder) 02/28/2012   Headache(784.0) 02/28/2012    PCP: No PCP REFERRING PROVIDER: Karle Barr, MD   REFERRING DIAG: R42 (ICD-10-CM) - Dizziness   THERAPY DIAG:  Dizziness and giddiness  ONSET DATE: 12/10/2022  Rationale for Evaluation and Treatment: Rehabilitation  SUBJECTIVE:   SUBJECTIVE STATEMENT: Patient reports doing fair. Still will get dizzy bending down/coming back up, especially the longer she's been bent over. Denies falls/near falls.  Pt accompanied by: self  PERTINENT HISTORY: PMH: Depression, ADD, Headaches, Photophobia  Per Dr. Suszanne Conners: Pt has been symptomatic for 10+ years. Dizziness is a spinning vertigo that lasts for minutes. Testing showed abnormal R ear electrocochleography, suggesting of R ear Meniere's disease. Possibility of vestibular migraine due to frequent headaches/photophobia.   PAIN:  Are you having pain? No  There were no vitals filed for this visit.    PRECAUTIONS: None  WEIGHT BEARING RESTRICTIONS: No  FALLS: Has patient fallen in last 6 months? 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   OBJECTIVE:   PATIENT SURVEYS:  FOTO DPS: 48, DFS: 44.4    VESTIBULAR TREATMENT:                                                                                                   - VOR x1 standing solid ground horizontal/vertical -> standing on airex-> standing on rockerboard  -bending down and coming up fixated on X    PATIENT EDUCATION: Education details: PT POC, use of sea sickness band, strategies to use on cruise to assist, gaze fixation tips, sea sickness glasses Person educated: Patient Education method: Explanation, Demonstration, and Handouts Education comprehension: verbalized understanding and returned demonstration  HOME EXERCISE PROGRAM: Standing VOR x1 with busy background   Access Code: [email or web address] URL: [email or web address] Date: 01/31/2023 Prepared by: Sherlie Ban  Exercises - Romberg Stance Eyes Closed on Foam Pad  - 1-2 x daily - 5 x weekly - 3 sets - 30 hold - Romberg Stance with Head Nods on Foam  Pad  - 1 x daily - 5 x weekly - 2 sets - 10 reps - Standing Marching  - 1 x daily - 5 x weekly - 1-2 sets - 10 reps -bending down coming up with gaze fixation   GOALS: Goals reviewed with patient? Yes  SHORT TERM GOALS: ALL STGS = LTGS  LONG TERM GOALS: Target date: 02/24/2023  Pt will perform DVA testing with 2 line difference or less with 2/10 dizziness afterwards in order to demo improved VOR.  Baseline: 2 line difference, 6/10 dizziness with PT having to help steady patient afterwards.  Goal status: INITIAL  2.  Pt will report 0/5 dizziness with items on coming up from R and L DixHallpike and supine to sit in order to demo decr dizziness.  Baseline: 3/5 Goal status: INITIAL  3.  mCTSIB to be assessed with goal written.  Baseline: goal not needed, 30  seconds on all  Goal status: GOAL N/A  4. Pt will improve DPS to at least 58 in order to demo improved functional outcomes related to dizziness.  Baseline: 48 Goal status: INITIAL   ASSESSMENT:  CLINICAL IMPRESSION: Patient seen for skilled PT session with emphasis on progressing VOR exercises. She remains most limited by vertical plane and does complain of, essentially, an aura before her dizziness onset. This may be more indicative of a vestibular migraine. She was able to complete 30s of multiple trials horizontal and vertical standing on various surfaces with minimal LOB, but does report increased symptoms. Discussed various strategies to assist with seasickness when she goes on her cruise. Continue POC as able.   OBJECTIVE IMPAIRMENTS: decreased activity tolerance, decreased balance, and dizziness.   ACTIVITY LIMITATIONS: carrying, lifting, bending, bed mobility, reach over head, and locomotion level  PARTICIPATION LIMITATIONS: community activity  PERSONAL FACTORS: Age, Past/current experiences, Time since onset of injury/illness/exacerbation, and 3+ comorbidities: Depression, ADD, Headaches, Photophobia  are also affecting patient's functional outcome.   REHAB POTENTIAL: Good  CLINICAL DECISION MAKING: Evolving/moderate complexity  EVALUATION COMPLEXITY: Moderate   PLAN:  PT FREQUENCY: 1x/week  PT DURATION: 8 weeks  PLANNED INTERVENTIONS: Therapeutic exercises, Therapeutic activity, Neuromuscular re-education, Balance training, Gait training, Patient/Family education, Self Care, Joint mobilization, Vestibular training, Manual therapy, and Re-evaluation  PLAN FOR NEXT SESSION: Work on vestibular system for balance. Bending for a while, reaching up. Progress VOR. Balance on compliant surfaces to mimic being on a boat.   How was the cruise?   Westley Foots, PT, DPT, CBIS 02/10/2023, 7:41 AM
